# Patient Record
Sex: Male | Born: 1995 | Race: Black or African American | Hispanic: No | Marital: Single | Smoking: Never smoker
Health system: Southern US, Community
[De-identification: ages and names within clinical notes are randomized; demographics above are authoritative.]

---

## 2017-10-27 ENCOUNTER — Emergency Department (HOSPITAL_COMMUNITY)
Admission: EM | Admit: 2017-10-27 | Discharge: 2017-10-27 | Disposition: A | Discharge status: Home / Self Care | Payer: Self-pay | Attending: Emergency Medicine | Admitting: Emergency Medicine

## 2017-10-27 ENCOUNTER — Other Ambulatory Visit: Payer: Self-pay

## 2017-10-27 ENCOUNTER — Emergency Department (HOSPITAL_COMMUNITY): Payer: Self-pay

## 2017-10-27 DIAGNOSIS — L0291 Cutaneous abscess, unspecified: Secondary | ICD-10-CM

## 2017-10-27 DIAGNOSIS — R2231 Localized swelling, mass and lump, right upper limb: Secondary | ICD-10-CM | POA: Insufficient documentation

## 2017-10-27 DIAGNOSIS — L03113 Cellulitis of right upper limb: Secondary | ICD-10-CM

## 2017-10-27 LAB — CBC WITH DIFFERENTIAL/PLATELET
Abs Immature Granulocytes: 0.02 10*3/uL (ref 0.00–0.07)
BASOS PCT: 1 %
Basophils Absolute: 0 10*3/uL (ref 0.0–0.1)
Eosinophils Absolute: 0.1 10*3/uL (ref 0.0–0.5)
Eosinophils Relative: 2 %
HCT: 43.6 % (ref 39.0–52.0)
HEMOGLOBIN: 15 g/dL (ref 13.0–17.0)
IMMATURE GRANULOCYTES: 0 %
LYMPHS PCT: 20 %
Lymphs Abs: 1.1 10*3/uL (ref 0.7–4.0)
MCH: 31.1 pg (ref 26.0–34.0)
MCHC: 34.4 g/dL (ref 30.0–36.0)
MCV: 90.3 fL (ref 80.0–100.0)
MONO ABS: 0.5 10*3/uL (ref 0.1–1.0)
MONOS PCT: 9 %
NEUTROS PCT: 68 %
Neutro Abs: 3.9 10*3/uL (ref 1.7–7.7)
PLATELETS: 233 10*3/uL (ref 150–400)
RBC: 4.83 MIL/uL (ref 4.22–5.81)
RDW: 12.2 % (ref 11.5–15.5)
WBC: 5.6 10*3/uL (ref 4.0–10.5)
nRBC: 0 % (ref 0.0–0.2)

## 2017-10-27 LAB — COMPREHENSIVE METABOLIC PANEL
ALBUMIN: 3.9 g/dL (ref 3.5–5.0)
ALT: 18 U/L (ref 0–44)
ANION GAP: 9 (ref 5–15)
AST: 18 U/L (ref 15–41)
Alkaline Phosphatase: 54 U/L (ref 38–126)
BILIRUBIN TOTAL: 1.1 mg/dL (ref 0.3–1.2)
BUN: 9 mg/dL (ref 6–20)
CHLORIDE: 104 mmol/L (ref 98–111)
CO2: 25 mmol/L (ref 22–32)
Calcium: 9.2 mg/dL (ref 8.9–10.3)
Creatinine, Ser: 0.99 mg/dL (ref 0.61–1.24)
GFR calc Af Amer: >60 mL/min (ref >60)
GFR calc non Af Amer: >60 mL/min (ref >60)
GLUCOSE: 94 mg/dL (ref 70–99)
Potassium: 3.7 mmol/L (ref 3.5–5.1)
Sodium: 138 mmol/L (ref 135–145)
TOTAL PROTEIN: 7 g/dL (ref 6.5–8.1)

## 2017-10-27 LAB — I-STAT CG4 LACTIC ACID, ED: LACTIC ACID, VENOUS: 0.89 mmol/L (ref 0.5–1.9)

## 2017-10-27 MED ORDER — CLINDAMYCIN HCL 300 MG PO CAPS: 300.0000 mg | ORAL_CAPSULE | Freq: Three times a day (TID) | ORAL | 0 refills | Status: DC

## 2017-10-27 NOTE — ED Triage Notes (Signed)
Pt states that he has eczema and "sometimes gets bumps" on his arms. Pt reports popping a bump and now having arm swelling with drainage around his right elbow. Pt also has new tattoo in same area.

## 2017-10-27 NOTE — Discharge Instructions (Signed)
You have been seen today in the Emergency Department for cellulitis, a superficial skin infection. Please take your antibiotics as prescribed for their ENTIRE prescribed duration.   May alternate between motrin and tylenol for fever, pain, and swelling.  Please follow up with your doctor or return to the ER in 2 days for recheck of your infection if you are not improving.  Call your doctor sooner or return to the ER if you develop worsening signs of infection such as: increased redness, increased pain, pus, fever, or other symptoms that concern you.  

## 2017-10-27 NOTE — ED Provider Notes (Signed)
[facility] EMERGENCY DEPARTMENT Provider Note   CSN: [id number] Arrival date & time: 10/27/17  1154     History   Chief Complaint Chief Complaint  Patient presents with  . Abscess    HPI Tommy Lin is a 22 y.o. male.  HPI 22 year old male with no pertinent past medical history presents to the emergency department today for evaluation of "pimple" to his right forearm over a tattoo that he received several weeks ago.  Patient also reports some associated redness, swelling and warmth.  States that his arm is sore to move.  Patient denies any fevers or chills.  Denies any vomiting or diarrhea.  Patient took ibuprofen for his pain prior to arrival.  He denies any history of reoccurring abscesses or diabetes.  Palpation and range of motion makes the pain worse.  Nothing makes the pain better.  Denies any other associated symptoms. No past medical history on file.  There are no active problems to display for this patient.         Home Medications    Prior to Admission medications   Not on File    Family History No family history on file.  Social History Social History   Tobacco Use  . Smoking status: Not on file  Substance Use Topics  . Alcohol use: Not on file  . Drug use: Not on file     Allergies   Patient has no known allergies.   Review of Systems Review of Systems  Constitutional: Negative for chills and fever.  HENT: Negative for congestion.   Gastrointestinal: Negative for nausea and vomiting.  Musculoskeletal: Positive for arthralgias, joint swelling and myalgias.  Skin: Positive for color change and wound.  Neurological: Negative for weakness and numbness.     Physical Exam Updated Vital Signs BP 125/73 (BP Location: Left Arm)   Pulse 91   Temp 99.4 F (37.4 C) (Oral)   Resp 16   Ht 5\' 8"  (1.727 m)   Wt 78 kg   SpO2 100%   BMI 26.15 kg/m    Physical Exam  Constitutional: He appears well-developed and  well-nourished. No distress.  HENT:  Head: Normocephalic and atraumatic.  Eyes: Right eye exhibits no discharge. Left eye exhibits no discharge. No scleral icterus.  Neck: Normal range of motion.  Pulmonary/Chest: No respiratory distress.  Musculoskeletal: Normal range of motion.  Radial pulses are 2+ bilaterally.  Normal grip strength.  Patient does have full range of motion of his right elbow with minimal pain.  Full range of motion of the right wrist and right shoulder without pain.  Skin compartments are soft at this time.  Brisk cap refill with sensation intact.  Neurological: He is alert.  Skin: Skin is warm and dry. Capillary refill takes less than 2 seconds. No pallor.  Patient has ulcerated lesion approximately 1 cm to the dorsal aspect of the upper right forearm.  There is purulent drainage noted.  Surrounding erythema and warmth.  Edema is noted.  Psychiatric: His behavior is normal. Judgment and thought content normal.  Nursing note and vitals reviewed.    ED Treatments / Results  Labs (all labs ordered are listed, but only abnormal results are displayed) Labs Reviewed  COMPREHENSIVE METABOLIC PANEL  CBC WITH DIFFERENTIAL/PLATELET  I-STAT CG4 LACTIC ACID, ED    EKG None  Radiology Dg Elbow Complete Right  Result Date: 10/27/2017 CLINICAL DATA:  Acute RIGHT elbow pain and swelling. Initial encounter. EXAM: RIGHT ELBOW - COMPLETE 3+  VIEW COMPARISON:  None. FINDINGS: Diffuse soft tissue swelling noted. No soft tissue gas or radiopaque foreign body noted. No fracture, subluxation or dislocation. No focal bony lesions are present. There is no evidence of joint effusion. IMPRESSION: Diffuse soft tissue swelling without bony or joint abnormality. Electronically Signed   By: Harmon Pier M.D.   On: 10/27/2017 14:48    Procedures Procedures (including critical care time)  Medications Ordered in ED Medications - No data to display   Initial Impression / Assessment and Plan /  ED Course  I have reviewed the triage vital signs and the nursing notes.  Pertinent labs & imaging results that were available during my care of the patient were reviewed by me and considered in my medical decision making (see chart for details).     Patient presents to the ED for evaluation of draining lesion to his right forearm after receiving a tattoo 3 weeks ago.  Patient is neurovascularly intact on exam.  Does have full range of motion of the right elbow without any significant pain.  He is nontoxic or septic appearing.  He is afebrile in the ED peer does not meet [facility] sepsis criteria.  No leukocytosis noted.  No significant electrolyte abnormality.  Lactic acid was normal.  X-ray shows no signs of joint effusion but does note diffuse soft tissue swelling.  The abscess is currently draining purulent discharge.  He has no significant area of fluctuance and the just appears to be induration noted.  Not feel the patient needs I&D at this time.  Patient be placed on clindamycin to cover for MRSA.  I suspect cellulitis and have low suspicion for septic arthritis at this time.  Patient will be given follow-up for 2 days for recheck and have discussed reasons to return the ED immediately.  Patient is comfort with this plan.  Pt is hemodynamically stable, in NAD, & able to ambulate in the ED. Evaluation does not show pathology that would require ongoing emergent intervention or inpatient treatment. I explained the diagnosis to the patient. Pain has been managed & has no complaints prior to dc. Pt is comfortable with above plan and is stable for discharge at this time. All questions were answered prior to disposition. Strict return precautions for f/u to the ED were discussed. Encouraged follow up with PCP.  Pt was dicussed with my attending Dr. Patria Mane who is agreeable with the above plan.   Final Clinical Impressions(s) / ED Diagnoses   Final diagnoses:  Cellulitis of right upper extremity  Abscess      ED Discharge Orders         Ordered    clindamycin (CLEOCIN) 300 MG capsule  3 times daily     10/27/17 1458          Rise Mu, PA-C 10/27/17 1613  Azalia Bilis, MD 10/27/17 [phone]

## 2018-09-14 ENCOUNTER — Encounter (HOSPITAL_COMMUNITY): Payer: Self-pay

## 2018-09-14 ENCOUNTER — Emergency Department (HOSPITAL_COMMUNITY): Payer: BC Managed Care – PPO

## 2018-09-14 ENCOUNTER — Emergency Department (HOSPITAL_COMMUNITY)
Admission: EM | Admit: 2018-09-14 | Discharge: 2018-09-15 | Disposition: A | Discharge status: Home / Self Care | Payer: BC Managed Care – PPO | Attending: Emergency Medicine | Admitting: Emergency Medicine

## 2018-09-14 ENCOUNTER — Other Ambulatory Visit: Payer: Self-pay

## 2018-09-14 DIAGNOSIS — S098XXA Other specified injuries of head, initial encounter: Secondary | ICD-10-CM | POA: Insufficient documentation

## 2018-09-14 DIAGNOSIS — Y9389 Activity, other specified: Secondary | ICD-10-CM | POA: Diagnosis not present

## 2018-09-14 DIAGNOSIS — R079 Chest pain, unspecified: Secondary | ICD-10-CM | POA: Diagnosis not present

## 2018-09-14 DIAGNOSIS — S3981XA Other specified injuries of abdomen, initial encounter: Secondary | ICD-10-CM | POA: Insufficient documentation

## 2018-09-14 DIAGNOSIS — Y929 Unspecified place or not applicable: Secondary | ICD-10-CM | POA: Insufficient documentation

## 2018-09-14 DIAGNOSIS — S3991XA Unspecified injury of abdomen, initial encounter: Secondary | ICD-10-CM

## 2018-09-14 DIAGNOSIS — Z79899 Other long term (current) drug therapy: Secondary | ICD-10-CM | POA: Insufficient documentation

## 2018-09-14 DIAGNOSIS — S4992XA Unspecified injury of left shoulder and upper arm, initial encounter: Secondary | ICD-10-CM

## 2018-09-14 DIAGNOSIS — S298XXA Other specified injuries of thorax, initial encounter: Secondary | ICD-10-CM | POA: Insufficient documentation

## 2018-09-14 DIAGNOSIS — S0990XA Unspecified injury of head, initial encounter: Secondary | ICD-10-CM

## 2018-09-14 DIAGNOSIS — R0789 Other chest pain: Secondary | ICD-10-CM

## 2018-09-14 DIAGNOSIS — Y998 Other external cause status: Secondary | ICD-10-CM | POA: Diagnosis not present

## 2018-09-14 MED ORDER — SODIUM CHLORIDE 0.9 % IV BOLUS
1000.0000 mL | Freq: Once | INTRAVENOUS | Status: AC
  Administered 2018-09-15: 01:00:00 1000 mL via INTRAVENOUS
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
## 2018-09-15 ENCOUNTER — Emergency Department (HOSPITAL_COMMUNITY): Payer: BC Managed Care – PPO

## 2018-09-15 ENCOUNTER — Encounter (HOSPITAL_COMMUNITY): Payer: Self-pay | Admitting: Radiology

## 2018-09-15 LAB — CBC WITH DIFFERENTIAL/PLATELET
Abs Immature Granulocytes: 0.01 10*3/uL (ref 0.00–0.07)
Basophils Absolute: 0 10*3/uL (ref 0.0–0.1)
Basophils Relative: 1 %
Eosinophils Absolute: 0.1 10*3/uL (ref 0.0–0.5)
Eosinophils Relative: 2 %
HCT: 42 % (ref 39.0–52.0)
Hemoglobin: 15 g/dL (ref 13.0–17.0)
Immature Granulocytes: 0 %
Lymphocytes Relative: 36 %
Lymphs Abs: 1.4 10*3/uL (ref 0.7–4.0)
MCH: 30.5 pg (ref 26.0–34.0)
MCHC: 35.7 g/dL (ref 30.0–36.0)
MCV: 85.5 fL (ref 80.0–100.0)
Monocytes Absolute: 0.4 10*3/uL (ref 0.1–1.0)
Monocytes Relative: 9 %
Neutro Abs: 2.1 10*3/uL (ref 1.7–7.7)
Neutrophils Relative %: 52 %
Platelets: 203 10*3/uL (ref 150–400)
RBC: 4.91 MIL/uL (ref 4.22–5.81)
RDW: 12.7 % (ref 11.5–15.5)
WBC: 4 10*3/uL (ref 4.0–10.5)
nRBC: 0 % (ref 0.0–0.2)

## 2018-09-15 LAB — COMPREHENSIVE METABOLIC PANEL
ALT: 21 U/L (ref 0–44)
AST: 25 U/L (ref 15–41)
Albumin: 4.3 g/dL (ref 3.5–5.0)
Alkaline Phosphatase: 43 U/L (ref 38–126)
Anion gap: 12 (ref 5–15)
BUN: 10 mg/dL (ref 6–20)
CO2: 23 mmol/L (ref 22–32)
Calcium: 9.1 mg/dL (ref 8.9–10.3)
Chloride: 101 mmol/L (ref 98–111)
Creatinine, Ser: 1.19 mg/dL (ref 0.61–1.24)
GFR calc Af Amer: >60 mL/min (ref >60)
GFR calc non Af Amer: >60 mL/min (ref >60)
Glucose, Bld: 94 mg/dL (ref 70–99)
Potassium: 3.3 mmol/L — ABNORMAL LOW (ref 3.5–5.1)
Sodium: 136 mmol/L (ref 135–145)
Total Bilirubin: 1.4 mg/dL — ABNORMAL HIGH (ref 0.3–1.2)
Total Protein: 7.5 g/dL (ref 6.5–8.1)

## 2018-09-15 LAB — ABO/RH: ABO/RH(D): A POS

## 2018-09-15 LAB — URINALYSIS, ROUTINE W REFLEX MICROSCOPIC
Bilirubin Urine: NEGATIVE
Glucose, UA: NEGATIVE mg/dL
Hgb urine dipstick: NEGATIVE
Ketones, ur: NEGATIVE mg/dL
Leukocytes,Ua: NEGATIVE
Nitrite: NEGATIVE
Protein, ur: NEGATIVE mg/dL
Specific Gravity, Urine: 1.02 (ref 1.005–1.030)
pH: 6 (ref 5.0–8.0)

## 2018-09-15 LAB — TYPE AND SCREEN
ABO/RH(D): A POS
Antibody Screen: NEGATIVE

## 2018-09-15 LAB — PROTIME-INR
INR: 1.2 (ref 0.8–1.2)
Prothrombin Time: 14.7 seconds (ref 11.4–15.2)

## 2018-09-15 LAB — RAPID URINE DRUG SCREEN, HOSP PERFORMED
Amphetamines: NOT DETECTED
Barbiturates: NOT DETECTED
Benzodiazepines: NOT DETECTED
Cocaine: NOT DETECTED
Opiates: NOT DETECTED
Tetrahydrocannabinol: NOT DETECTED

## 2018-09-15 LAB — ETHANOL: Alcohol, Ethyl (B): <10 mg/dL (ref <10)

## 2018-09-15 LAB — I-STAT CREATININE, ED: Creatinine, Ser: 1.1 mg/dL (ref 0.61–1.24)

## 2018-09-15 MED ORDER — METHOCARBAMOL 750 MG PO TABS: 750.0000 mg | ORAL_TABLET | Freq: Three times a day (TID) | ORAL | 0 refills | Status: AC | PRN

## 2018-09-15 MED ORDER — IOHEXOL 300 MG/ML  SOLN
100.0000 mL | Freq: Once | INTRAMUSCULAR | Status: AC | PRN
  Administered 2018-09-15: 100 mL via INTRAVENOUS
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
## 2018-09-15 NOTE — ED Notes (Signed)
Patient transported to CT 

## 2020-12-09 ENCOUNTER — Other Ambulatory Visit: Payer: Self-pay

## 2020-12-09 ENCOUNTER — Emergency Department (HOSPITAL_COMMUNITY)
Admission: EM | Admit: 2020-12-09 | Discharge: 2020-12-09 | Disposition: A | Discharge status: Home / Self Care | Payer: BC Managed Care – PPO | Attending: Student | Admitting: Student

## 2020-12-09 ENCOUNTER — Encounter (HOSPITAL_COMMUNITY): Payer: Self-pay | Admitting: Emergency Medicine

## 2020-12-09 DIAGNOSIS — T23152A Burn of first degree of left palm, initial encounter: Secondary | ICD-10-CM | POA: Insufficient documentation

## 2020-12-09 DIAGNOSIS — X153XXA Contact with hot saucepan or skillet, initial encounter: Secondary | ICD-10-CM | POA: Diagnosis not present

## 2020-12-09 DIAGNOSIS — T23151A Burn of first degree of right palm, initial encounter: Secondary | ICD-10-CM | POA: Diagnosis not present

## 2020-12-09 DIAGNOSIS — T23109A Burn of first degree of unspecified hand, unspecified site, initial encounter: Secondary | ICD-10-CM

## 2020-12-09 MED ORDER — OXYCODONE-ACETAMINOPHEN 5-325 MG PO TABS: 1.0000 | ORAL_TABLET | Freq: Four times a day (QID) | ORAL | 0 refills | Status: DC | PRN

## 2020-12-09 MED ORDER — OXYCODONE-ACETAMINOPHEN 5-325 MG PO TABS
1.0000 | ORAL_TABLET | Freq: Once | ORAL | Status: AC
  Administered 2020-12-09: 1 via ORAL
  Filled 2020-12-09: qty 1

## 2020-12-09 MED ORDER — OXYCODONE-ACETAMINOPHEN 5-325 MG PO TABS: 1.0000 | ORAL_TABLET | Freq: Four times a day (QID) | ORAL | 0 refills | Status: AC | PRN
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
